# Patient Record
Sex: Female | Born: 1955 | Race: White | Hispanic: No | Smoking: Never smoker
Health system: Southern US, Community
[De-identification: ages and names within clinical notes are randomized; demographics above are authoritative.]

---

## 2020-03-27 ENCOUNTER — Emergency Department: Payer: BC Managed Care – PPO

## 2020-03-27 ENCOUNTER — Other Ambulatory Visit: Payer: Self-pay

## 2020-03-27 ENCOUNTER — Emergency Department
Admission: EM | Admit: 2020-03-27 | Discharge: 2020-03-27 | Disposition: A | Discharge status: Home / Self Care | Payer: BC Managed Care – PPO | Attending: Emergency Medicine | Admitting: Emergency Medicine

## 2020-03-27 DIAGNOSIS — N201 Calculus of ureter: Secondary | ICD-10-CM | POA: Diagnosis not present

## 2020-03-27 DIAGNOSIS — R109 Unspecified abdominal pain: Secondary | ICD-10-CM | POA: Diagnosis present

## 2020-03-27 LAB — BASIC METABOLIC PANEL
Anion gap: 10 (ref 5–15)
BUN: 19 mg/dL (ref 8–23)
CO2: 22 mmol/L (ref 22–32)
Calcium: 9.7 mg/dL (ref 8.9–10.3)
Chloride: 107 mmol/L (ref 98–111)
Creatinine, Ser: 0.94 mg/dL (ref 0.44–1.00)
GFR calc Af Amer: >60 mL/min (ref >60)
GFR calc non Af Amer: >60 mL/min (ref >60)
Glucose, Bld: 167 mg/dL — ABNORMAL HIGH (ref 70–99)
Potassium: 4 mmol/L (ref 3.5–5.1)
Sodium: 139 mmol/L (ref 135–145)

## 2020-03-27 LAB — CBC
HCT: 37.5 % (ref 36.0–46.0)
Hemoglobin: 12.6 g/dL (ref 12.0–15.0)
MCH: 29.8 pg (ref 26.0–34.0)
MCHC: 33.6 g/dL (ref 30.0–36.0)
MCV: 88.7 fL (ref 80.0–100.0)
Platelets: 237 10*3/uL (ref 150–400)
RBC: 4.23 MIL/uL (ref 3.87–5.11)
RDW: 12.6 % (ref 11.5–15.5)
WBC: 10.6 10*3/uL — ABNORMAL HIGH (ref 4.0–10.5)
nRBC: 0 % (ref 0.0–0.2)

## 2020-03-27 LAB — URINALYSIS, COMPLETE (UACMP) WITH MICROSCOPIC
Bacteria, UA: NONE SEEN
Bilirubin Urine: NEGATIVE
Glucose, UA: 50 mg/dL — AB
Ketones, ur: 5 mg/dL — AB
Leukocytes,Ua: NEGATIVE
Nitrite: NEGATIVE
Protein, ur: 30 mg/dL — AB
RBC / HPF: >50 RBC/hpf — ABNORMAL HIGH (ref 0–5)
Specific Gravity, Urine: 1.02 (ref 1.005–1.030)
pH: 7 (ref 5.0–8.0)

## 2020-03-27 MED ORDER — KETOROLAC TROMETHAMINE 30 MG/ML IJ SOLN
30.0000 mg | Freq: Once | INTRAMUSCULAR | Status: AC
  Administered 2020-03-27: 30 mg via INTRAMUSCULAR
  Filled 2020-03-27: qty 1

## 2020-03-27 MED ORDER — ONDANSETRON 4 MG PO TBDP
4.0000 mg | ORAL_TABLET | Freq: Three times a day (TID) | ORAL | 0 refills | Status: DC | PRN
Start: 2020-03-27 — End: 2020-04-13

## 2020-03-27 MED ORDER — OXYCODONE-ACETAMINOPHEN 5-325 MG PO TABS: 1.0000 | ORAL_TABLET | Freq: Four times a day (QID) | ORAL | 0 refills | Status: DC | PRN

## 2020-03-27 MED ORDER — TAMSULOSIN HCL 0.4 MG PO CAPS: 0.4000 mg | ORAL_CAPSULE | Freq: Every day | ORAL | 0 refills | Status: DC

## 2020-03-27 MED ORDER — CEPHALEXIN 500 MG PO CAPS: 500.0000 mg | ORAL_CAPSULE | Freq: Three times a day (TID) | ORAL | 0 refills | Status: DC

## 2020-03-27 NOTE — ED Triage Notes (Signed)
Patient reports right flank pain that radiates into right lower abdomen.  Patient denies urinary symptoms.

## 2020-03-27 NOTE — ED Provider Notes (Signed)
[facility] Emergency Department Provider Note  ____________________________________________   First MD Initiated Contact with Patient 03/27/20 0801     (approximate)  I have reviewed the triage vital signs and the nursing notes.   HISTORY  Chief Complaint Flank Pain   HPI Kelsey Lowery is a 64 y.o. female presents to the ED with complaint of right flank pain that occurred while she was sleeping last evening.  Patient states that she was unable to get comfortable in bed and was up and down a lot during the night.  She states that it eventually started radiating around to her the front of her body and into the groin area.  Patient denies any urinary symptoms.  There is been no nausea, vomiting, fever or chills.  She denies any previous history of stones and no symptoms of UTI currently.  She rates her pain as 6 out of 10.      No past medical history on file.  There are no problems to display for this patient.   Prior to Admission medications   Medication Sig Start Date End Date Taking? Authorizing Provider  cephALEXin (KEFLEX) 500 MG capsule Take 1 capsule (500 mg total) by mouth 3 (three) times daily. 03/27/20   Tommi Rumps, PA-C  ondansetron (ZOFRAN ODT) 4 MG disintegrating tablet Take 1 tablet (4 mg total) by mouth every 8 (eight) hours as needed for nausea or vomiting. 03/27/20   Tommi Rumps, PA-C  oxyCODONE-acetaminophen (PERCOCET) 5-325 MG tablet Take 1 tablet by mouth every 6 (six) hours as needed for severe pain. 03/27/20 03/27/21  Tommi Rumps, PA-C  tamsulosin (FLOMAX) 0.4 MG CAPS capsule Take 1 capsule (0.4 mg total) by mouth daily. 03/27/20   Tommi Rumps, PA-C    Allergies Other and Sulfa antibiotics  No family history on file.  Social History Social History   Tobacco Use  . Smoking status: Not on file  Substance Use Topics  . Alcohol use: Not on file  . Drug use: Not on file    Review of Systems Constitutional: No  fever/chills Eyes: No visual changes. ENT: No sore throat. Cardiovascular: Denies chest pain. Respiratory: Denies shortness of breath. Gastrointestinal: No abdominal pain.  No nausea, no vomiting.  No diarrhea.   Genitourinary: Negative for dysuria.  Positive right flank pain. Musculoskeletal: Negative for muscle skeletal pain. Skin: Negative for rash. Neurological: Negative for headaches, focal weakness or numbness. ____________________________________________   PHYSICAL EXAM:  VITAL SIGNS: ED Triage Vitals  Enc Vitals Group     BP 03/27/20 0531 (!) 151/76     Pulse Rate 03/27/20 0531 86     Resp 03/27/20 0531 19     Temp 03/27/20 0531 98.4 F (36.9 C)     Temp Source 03/27/20 0531 Oral     SpO2 03/27/20 0531 100 %     Weight 03/27/20 0532 126 lb (57.2 kg)     Height 03/27/20 0532 5\' 3"  (1.6 m)     Head Circumference --      Peak Flow --      Pain Score 03/27/20 0532 6     Pain Loc --      Pain Edu? --      Excl. in GC? --     Constitutional: Alert and oriented. Well appearing and in no acute distress. Eyes: Conjunctivae are normal. PERRL. EOMI. Head: Atraumatic. Neck: No stridor.   Cardiovascular: Normal rate, regular rhythm. Grossly normal heart sounds.  Good peripheral  circulation. Respiratory: Normal respiratory effort.  No retractions. Lungs CTAB. Gastrointestinal: Soft and nontender. No distention.  No CVA tenderness. Musculoskeletal: Moves upper and lower extremities they have difficulty.  There is no tenderness on palpation of the thoracic or lumbar spine.  No edema noted lower extremities. Neurologic:  Normal speech and language. No gross focal neurologic deficits are appreciated. No gait instability. Skin:  Skin is warm, dry and intact. No rash noted. Psychiatric: Mood and affect are normal. Speech and behavior are normal.  ____________________________________________   LABS (all labs ordered are listed, but only abnormal results are displayed)  Labs  Reviewed  URINALYSIS, COMPLETE (UACMP) WITH MICROSCOPIC - Abnormal; Notable for the following components:      Result Value   Color, Urine YELLOW (*)    APPearance HAZY (*)    Glucose, UA 50 (*)    Hgb urine dipstick LARGE (*)    Ketones, ur 5 (*)    Protein, ur 30 (*)    RBC / HPF >50 (*)    All other components within normal limits  BASIC METABOLIC PANEL - Abnormal; Notable for the following components:   Glucose, Bld 167 (*)    All other components within normal limits  CBC - Abnormal; Notable for the following components:   WBC 10.6 (*)    All other components within normal limits    RADIOLOGY   Official radiology report(s): CT Renal Stone Study  Result Date: 03/27/2020 CLINICAL DATA:  Right-sided flank pain EXAM: CT ABDOMEN AND PELVIS WITHOUT CONTRAST TECHNIQUE: Multidetector CT imaging of the abdomen and pelvis was performed following the standard protocol without IV contrast. COMPARISON:  None. FINDINGS: Lower chest: Calcified granuloma is noted in the right lung base. No focal infiltrate is seen. Hepatobiliary: No focal liver abnormality is seen. No gallstones, gallbladder wall thickening, or biliary dilatation. Pancreas: Unremarkable. No pancreatic ductal dilatation or surrounding inflammatory changes. Spleen: Normal in size without focal abnormality. Adrenals/Urinary Tract: Adrenal glands are within normal limits. Right kidney demonstrates mild hydronephrosis and hydroureter secondary to a proximal right ureteral stone measuring 5 mm. A few punctate nonobstructing renal stones are noted bilaterally. The bladder is partially distended. Stomach/Bowel: The appendix is within normal limits. Scattered fecal material is noted throughout the colon consistent with mild constipation. No obstructive or inflammatory changes of the colon or small bowel are seen. Stomach is within normal limits. Vascular/Lymphatic: Aortic atherosclerosis. No enlarged abdominal or pelvic lymph nodes.  Reproductive: Uterus and bilateral adnexa are unremarkable. Other: No abdominal wall hernia or abnormality. No abdominopelvic ascites. Musculoskeletal: No acute abnormality noted. IMPRESSION: 5 mm proximal right ureteral stone with obstructive change. Punctate nonobstructing stones bilaterally. Mild constipation. Electronically Signed   By: Alcide Clever M.D.   On: 03/27/2020 09:28    ____________________________________________   PROCEDURES  Procedure(s) performed (including Critical Care):  Procedures   ____________________________________________   INITIAL IMPRESSION / ASSESSMENT AND PLAN / ED COURSE  As part of my medical decision making, I reviewed the following data within the electronic MEDICAL RECORD NUMBER Notes from prior ED visits and [place] Controlled Substance Database  64 year old female presents to the ED with complaint of right flank pain that occurred while sleeping last evening.  Patient states she was unable to get comfortable in bed and was up and down a lot.  She denies any urinary symptoms and no previous stone.  Urinalysis was suspicious for stone with RBCs greater than 50,000.  CT scan shows a 5 mm stone in the right urethra  with some punctate stones in the left urethra.  There is mild hydronephrosis noted on the right.  Patient was made aware.  She was given Toradol 30 mg IM while in the ED.  She is discharged with a prescription for Percocet 5/325 every 6 hours as needed, Zofran ODT 4 mg every 8 hours as needed, Keflex 500 mg 3 times daily for 7 days and Flomax 0.4 mg daily.  Patient states that she had some itching in her 37s with sulfa drugs and Flomax may have a crossover reaction.  Patient was made aware that should she begin itching to discontinue the Flomax immediately and begin taking some Benadryl.  She is to follow-up with Dr. Richardo Hanks who is the urologist on call for any continued problems.  She is to return to the emergency department if any severe worsening of her  symptoms.   ____________________________________________   FINAL CLINICAL IMPRESSION(S) / ED DIAGNOSES  Final diagnoses:  Ureterolithiasis  Right flank pain     ED Discharge Orders         Ordered    oxyCODONE-acetaminophen (PERCOCET) 5-325 MG tablet  Every 6 hours PRN     Discontinue  Reprint     03/27/20 1011    ondansetron (ZOFRAN ODT) 4 MG disintegrating tablet  Every 8 hours PRN     Discontinue  Reprint     03/27/20 1011    cephALEXin (KEFLEX) 500 MG capsule  3 times daily     Discontinue  Reprint     03/27/20 1011    tamsulosin (FLOMAX) 0.4 MG CAPS capsule  Daily     Discontinue  Reprint     03/27/20 1011           Note:  This document was prepared using Dragon voice recognition software and may include unintentional dictation errors.    Tommi Rumps, PA-C 03/27/20 1032    Delton [place], MD 03/27/20 1515

## 2020-03-27 NOTE — Discharge Instructions (Signed)
Call make an appointment with the urologist listed on your discharge papers.  You may also return to the emergency department over the weekend if any severe worsening of your symptoms such as fever, chills, nausea or vomiting.  Begin taking Keflex 500 mg 3 times daily for 7 days to prevent infection.  Zofran if needed for nausea and vomiting.  The pain medication is oxycodone with acetaminophen.  This medication could cause drowsiness and increase your risk for injury.  Do not take this medication while you are driving.  Flomax is the medication I spoke to you about that could cause some itching because of your past history of itching with a sulfa drug.  If this happens discontinue taking the Flomax and begin taking Benadryl for the itching.

## 2020-03-30 ENCOUNTER — Ambulatory Visit
Admission: RE | Admit: 2020-03-30 | Discharge: 2020-03-30 | Disposition: A | Discharge status: Home / Self Care | Payer: BC Managed Care – PPO | Attending: Urology | Admitting: Urology

## 2020-03-30 ENCOUNTER — Other Ambulatory Visit: Admission: RE | Admit: 2020-03-30 | Payer: BC Managed Care – PPO | Source: Home / Self Care

## 2020-03-30 ENCOUNTER — Other Ambulatory Visit: Payer: Self-pay

## 2020-03-30 ENCOUNTER — Other Ambulatory Visit: Payer: Self-pay | Admitting: *Deleted

## 2020-03-30 ENCOUNTER — Ambulatory Visit (INDEPENDENT_AMBULATORY_CARE_PROVIDER_SITE_OTHER): Payer: BC Managed Care – PPO | Admitting: Urology

## 2020-03-30 ENCOUNTER — Ambulatory Visit
Admission: RE | Admit: 2020-03-30 | Discharge: 2020-03-30 | Disposition: A | Discharge status: Home / Self Care | Payer: BC Managed Care – PPO | Source: Ambulatory Visit | Attending: Urology | Admitting: Urology

## 2020-03-30 ENCOUNTER — Encounter: Payer: Self-pay | Admitting: Urology

## 2020-03-30 VITALS — BP 136/71 | HR 77 | Ht 62.5 in | Wt 128.0 lb

## 2020-03-30 DIAGNOSIS — N200 Calculus of kidney: Secondary | ICD-10-CM

## 2020-03-30 NOTE — Patient Instructions (Signed)
Kidney Stones  Kidney stones are solid, rock-like deposits that form inside of the kidneys. The kidneys are a pair of organs that make urine. A kidney stone may form in a kidney and move into other parts of the urinary tract, including the tubes that connect the kidneys to the bladder (ureters), the bladder, and the tube that carries urine out of the body (urethra). As the stone moves through these areas, it can cause intense pain and block the flow of urine. Kidney stones are created when high levels of certain minerals are found in the urine. The stones are usually passed out of the body through urination, but in some cases, medical treatment may be needed to remove them. What are the causes? Kidney stones may be caused by:  A condition in which certain glands produce too much parathyroid hormone (primary hyperparathyroidism), which causes too much calcium buildup in the blood.  A buildup of uric acid crystals in the bladder (hyperuricosuria). Uric acid is a chemical that the body produces when you eat certain foods. It usually exits the body in the urine.  Narrowing (stricture) of one or both of the ureters.  A kidney blockage that is present at birth (congenital obstruction).  Past surgery on the kidney or the ureters, such as gastric bypass surgery. What increases the risk? The following factors may make you more likely to develop this condition:  Having had a kidney stone in the past.  Having a family history of kidney stones.  Not drinking enough water.  Eating a diet that is high in protein, salt (sodium), or sugar.  Being overweight or obese. What are the signs or symptoms? Symptoms of a kidney stone may include:  Pain in the side of the abdomen, right below the ribs (flank pain). Pain usually spreads (radiates) to the groin.  Needing to urinate frequently or urgently.  Painful urination.  Blood in the urine (hematuria).  Nausea.  Vomiting.  Fever and chills. How  is this diagnosed? This condition may be diagnosed based on:  Your symptoms and medical history.  A physical exam.  Blood tests.  Urine tests. These may be done before and after the stone passes out of your body through urination.  Imaging tests, such as a CT scan, abdominal X-ray, or ultrasound.  A procedure to examine the inside of the bladder (cystoscopy). How is this treated? Treatment for kidney stones depends on the size, location, and makeup of the stones. Kidney stones will often pass out of the body through urination. You may need to:  Increase your fluid intake to help pass the stone. In some cases, you may be given fluids through an IV and may need to be monitored at the hospital.  Take medicine for pain.  Make changes in your diet to help prevent kidney stones from coming back. Sometimes, medical procedures are needed to remove a kidney stone. This may involve:  A procedure to break up kidney stones using: ? A focused beam of light (laser therapy). ? Shock waves (extracorporeal shock wave lithotripsy).  Surgery to remove kidney stones. This may be needed if you have severe pain or have stones that block your urinary tract. Follow these instructions at home: Medicines  Take over-the-counter and prescription medicines only as told by your health care provider.  Ask your health care provider if the medicine prescribed to you requires you to avoid driving or using heavy machinery. Eating and drinking  Drink enough fluid to keep your urine pale yellow.   You may be instructed to drink at least 8-10 glasses of water each day. This will help you pass the kidney stone.  If directed, change your diet. This may include: ? Limiting how much sodium you eat. ? Eating more fruits and vegetables. ? Limiting how much animal protein--such as red meat, poultry, fish, and eggs--you eat.  Follow instructions from your health care provider about eating or drinking  restrictions. General instructions  Collect urine samples as told by your health care provider. You may need to collect a urine sample: ? 24 hours after you pass the stone. ? 8-12 weeks after passing the kidney stone, and every 6-12 months after that.  Strain your urine every time you urinate, for as long as directed. Use the strainer that your health care provider recommends.  Do not throw out the kidney stone after passing it. Keep the stone so it can be tested by your health care provider. Testing the makeup of your kidney stone may help prevent you from getting kidney stones in the future.  Keep all follow-up visits as told by your health care provider. This is important. You may need follow-up X-rays or ultrasounds to make sure that your stone has passed. How is this prevented? To prevent another kidney stone:  Drink enough fluid to keep your urine pale yellow. This is the best way to prevent kidney stones.  Eat a healthy diet and follow recommendations from your health care provider about foods to avoid. You may be instructed to eat a low-protein diet. Recommendations vary depending on the type of kidney stone that you have.  Maintain a healthy weight. Where to find more information  [place] ([facility]): www.kidney.[facility]): www.urologyhealth.org Contact a health care provider if:  You have pain that gets worse or does not get better with medicine. Get help right away if:  You have a fever or chills.  You develop severe pain.  You develop new abdominal pain.  You faint.  You are unable to urinate. Summary  Kidney stones are solid, rock-like deposits that form inside of the kidneys.  Kidney stones can cause nausea, vomiting, blood in the urine, abdominal pain, and the urge to urinate frequently.  Treatment for kidney stones depends on the size, location, and makeup of the stones. Kidney stones will often pass out of the body  through urination.  Kidney stones can be prevented by drinking enough fluids, eating a healthy diet, and maintaining a healthy weight. This information is not intended to replace advice given to you by your health care provider. Make sure you discuss any questions you have with your health care provider. Document Revised: 12/17/2018 Document Reviewed: 12/17/2018 Elsevier Patient Education  2020 [place] is a treatment that can sometimes help eliminate kidney stones and the pain that they cause. A form of lithotripsy, also known as extracorporeal shock wave lithotripsy, is a nonsurgical procedure that crushes a kidney stone with shock waves. These shock waves pass through your body and focus on the kidney stone. They cause the kidney stone to break up while it is still in the urinary tract. This makes it easier for the smaller pieces of stone to pass in the urine. Tell a health care provider about:  Any allergies you have.  All medicines you are taking, including vitamins, herbs, eye drops, creams, and over-the-counter medicines.  Any blood disorders you have.  Any surgeries you have had.  Any medical conditions  you have.  Whether you are pregnant or may be pregnant.  Any problems you or family members have had with anesthetic medicines. What are the risks? Generally, this is a safe procedure. However, problems may occur, including:  Infection.  Bleeding of the kidney.  Bruising of the kidney or skin.  Scarring of the kidney, which can lead to: ? Increased blood pressure. ? Poor kidney function. ? Return (recurrence) of kidney stones.  Damage to other structures or organs, such as the liver, colon, spleen, or pancreas.  Blockage (obstruction) of the the tube that carries urine from the kidney to the bladder (ureter).  Failure of the kidney stone to break into pieces (fragments). What happens before the procedure? Staying hydrated Follow  instructions from your health care provider about hydration, which may include:  Up to 2 hours before the procedure - you may continue to drink clear liquids, such as water, clear fruit juice, black coffee, and plain tea. Eating and drinking restrictions Follow instructions from your health care provider about eating and drinking, which may include:  8 hours before the procedure - stop eating heavy meals or foods such as meat, fried foods, or fatty foods.  6 hours before the procedure - stop eating light meals or foods, such as toast or cereal.  6 hours before the procedure - stop drinking milk or drinks that contain milk.  2 hours before the procedure - stop drinking clear liquids. General instructions  Plan to have someone take you home from the hospital or clinic.  Ask your health care provider about: ? Changing or stopping your regular medicines. This is especially important if you are taking diabetes medicines or blood thinners. ? Taking medicines such as aspirin and ibuprofen. These medicines and other NSAIDs can thin your blood. Do not take these medicines for 7 days before your procedure if your health care provider instructs you not to.  You may have tests, such as: ? Blood tests. ? Urine tests. ? Imaging tests, such as a CT scan. What happens during the procedure?  To lower your risk of infection: ? Your health care team will wash or sanitize their hands. ? Your skin will be washed with soap.  An IV tube will be inserted into one of your veins. This tube will give you fluids and medicines.  You will be given one or more of the following: ? A medicine to help you relax (sedative). ? A medicine to make you fall asleep (general anesthetic).  A water-filled cushion may be placed behind your kidney or on your abdomen. In some cases you may be placed in a tub of lukewarm water.  Your body will be positioned in a way that makes it easy to target the kidney stone.  A  flexible tube with holes in it (stent) may be placed in the ureter. This will help keep urine flowing from the kidney if the fragments of the stone have been blocking the ureter.  An X-ray or ultrasound exam will be done to locate your stone.  Shock waves will be aimed at the stone. If you are awake, you may feel a tapping sensation as the shock waves pass through your body. The procedure may vary among health care providers and hospitals. What happens after the procedure?  You may have an X-ray to see whether the procedure was able to break up the kidney stone and how much of the stone has passed. If large stone fragments remain after treatment, you may  need to have a second procedure at a later time.  Your blood pressure, heart rate, breathing rate, and blood oxygen level will be monitored until the medicines you were given have worn off.  You may be given antibiotics or pain medicine as needed.  If a stent was placed in your ureter during surgery, it may stay in place for a few weeks.  You may need strain your urine to collect pieces of the kidney stone for testing.  You will need to drink plenty of water.  Do not drive for 24 hours if you were given a sedative. Summary  Lithotripsy is a treatment that can sometimes help eliminate kidney stones and the pain that they cause.  A form of lithotripsy, also known as extracorporeal shock wave lithotripsy, is a nonsurgical procedure that crushes a kidney stone with shock waves.  Generally, this is a safe procedure. However, problems may occur, including damage to the kidney or other organs, infection, or obstruction of the tube that carries urine from the kidney to the bladder (ureter).  When you go home, you will need to drink plenty of water. You may be asked to strain your urine to collect pieces of the kidney stone for testing. This information is not intended to replace advice given to you by your health care provider. Make sure you  discuss any questions you have with your health care provider. Document Revised: 11/11/2018 Document Reviewed: 06/21/2016 Elsevier Patient Education  2020 [employer]   Dietary Guidelines to Help Prevent Kidney Stones Kidney stones are deposits of minerals and salts that form inside your kidneys. Your risk of developing kidney stones may be greater depending on your diet, your lifestyle, the medicines you take, and whether you have certain medical conditions. Most people can reduce their chances of developing kidney stones by following the instructions below. Depending on your overall health and the type of kidney stones you tend to develop, your dietitian may give you more specific instructions. What are tips for following this plan? Reading food labels  Choose foods with "no salt added" or "low-salt" labels. Limit your sodium intake to less than 1500 mg per day.  Choose foods with calcium for each meal and snack. Try to eat about 300 mg of calcium at each meal. Foods that contain 200-500 mg of calcium per serving include: ? 8 oz (237 ml) of milk, fortified nondairy milk, and fortified fruit juice. ? 8 oz (237 ml) of kefir, yogurt, and soy yogurt. ? 4 oz (118 ml) of tofu. ? 1 oz of cheese. ? 1 cup (300 g) of dried figs. ? 1 cup (91 g) of cooked broccoli. ? 1-3 oz can of sardines or mackerel.  Most people need 1000 to 1500 mg of calcium each day. Talk to your dietitian about how much calcium is recommended for you. Shopping  Buy plenty of fresh fruits and vegetables. Most people do not need to avoid fruits and vegetables, even if they contain nutrients that may contribute to kidney stones.  When shopping for convenience foods, choose: ? Whole pieces of fruit. ? Premade salads with dressing on the side. ? Low-fat fruit and yogurt smoothies.  Avoid buying frozen meals or prepared deli foods.  Look for foods with live cultures, such as yogurt and kefir. Cooking  Do not add salt to  food when cooking. Place a salt shaker on the table and allow each person to add his or her own salt to taste.  Use vegetable protein, such  as beans, textured vegetable protein (TVP), or tofu instead of meat in pasta, casseroles, and soups. Meal planning   Eat less salt, if told by your dietitian. To do this: ? Avoid eating processed or premade food. ? Avoid eating fast food.  Eat less animal protein, including cheese, meat, poultry, or fish, if told by your dietitian. To do this: ? Limit the number of times you have meat, poultry, fish, or cheese each week. Eat a diet free of meat at least 2 days a week. ? Eat only one serving each day of meat, poultry, fish, or seafood. ? When you prepare animal protein, cut pieces into small portion sizes. For most meat and fish, one serving is about the size of one deck of cards.  Eat at least 5 servings of fresh fruits and vegetables each day. To do this: ? Keep fruits and vegetables on hand for snacks. ? Eat 1 piece of fruit or a handful of berries with breakfast. ? Have a salad and fruit at lunch. ? Have two kinds of vegetables at dinner.  Limit foods that are high in a substance called oxalate. These include: ? Spinach. ? Rhubarb. ? Beets. ? Potato chips and french fries. ? Nuts.  If you regularly take a diuretic medicine, make sure to eat at least 1-2 fruits or vegetables high in potassium each day. These include: ? Avocado. ? Banana. ? Orange, prune, carrot, or tomato juice. ? Baked potato. ? Cabbage. ? Beans and split peas. General instructions   Drink enough fluid to keep your urine clear or pale yellow. This is the most important thing you can do.  Talk to your health care provider and dietitian about taking daily supplements. Depending on your health and the cause of your kidney stones, you may be advised: ? Not to take supplements with vitamin C. ? To take a calcium supplement. ? To take a daily probiotic supplement. ? To  take other supplements such as magnesium, fish oil, or vitamin B6.  Take all medicines and supplements as told by your health care provider.  Limit alcohol intake to no more than 1 drink a day for nonpregnant women and 2 drinks a day for men. One drink equals 12 oz of beer, 5 oz of wine, or 1 oz of hard liquor.  Lose weight if told by your health care provider. Work with your dietitian to find strategies and an eating plan that works best for you. What foods are not recommended? Limit your intake of the following foods, or as told by your dietitian. Talk to your dietitian about specific foods you should avoid based on the type of kidney stones and your overall health. Grains Breads. Bagels. Rolls. Baked goods. Salted crackers. Cereal. Pasta. Vegetables Spinach. Rhubarb. Beets. Canned vegetables. Rosita Fire. Olives. Meats and other protein foods Nuts. Nut butters. Large portions of meat, poultry, or fish. Salted or cured meats. Deli meats. Hot dogs. Sausages. Dairy Cheese. Beverages Regular soft drinks. Regular vegetable juice. Seasonings and other foods Seasoning blends with salt. Salad dressings. Canned soups. Soy sauce. Ketchup. Barbecue sauce. Canned pasta sauce. Casseroles. Pizza. Lasagna. Frozen meals. Potato chips. [place] fries. Summary  You can reduce your risk of kidney stones by making changes to your diet.  The most important thing you can do is drink enough fluid. You should drink enough fluid to keep your urine clear or pale yellow.  Ask your health care provider or dietitian how much protein from animal sources you should eat each  day, and also how much salt and calcium you should have each day. This information is not intended to replace advice given to you by your health care provider. Make sure you discuss any questions you have with your health care provider. Document Revised: 11/20/2018 Document Reviewed: 07/11/2016 Elsevier Patient Education  2020 [employer].

## 2020-03-30 NOTE — Progress Notes (Signed)
   03/30/20 3:42 PM   Clarisa Fling 1956/01/30 [id number]  CC: Right proximal ureteral stone  HPI: I saw Ms. Kelsey Lowery in urology clinic today for evaluation of a 6 mm right proximal ureteral stone.  She originally noticed some abdominal discomfort 4 to 5 days ago, that ultimately escalated to severe right-sided flank pain.  She presented to the ED on 03/27/2020 and CT showed a 6 mm right proximal ureteral stone with upstream hydronephrosis.  Urinalysis and lab work were benign and she was discharged home with pain medications.  She denies any prior history of stones.  She has not had any pain for the last 2 days and is comfortable today.  She has had some worsening urinary urgency and frequency, but denies any dysuria.  She denies any fevers or chills.    Social History:  reports that she has never smoked. She has never used smokeless tobacco. She reports that she does not drink alcohol and does not use drugs.  Physical Exam: BP 136/71   Pulse 77   Ht 5' 2.5" (1.588 m)   Wt 128 lb (58.1 kg)   BMI 23.04 kg/m    Constitutional:  Alert and oriented, No acute distress. Cardiovascular: No clubbing, cyanosis, or edema. Respiratory: Normal respiratory effort, no increased work of breathing. GI: Abdomen is soft, nontender, nondistended, no abdominal masses GU: No CVA tenderness  Laboratory Data: Reviewed, see HPI  Pertinent Imaging: I have personally reviewed the CT stone protocol from 8/14 2021 as well as the KUB today.  On the CT there is a 6 mm proximal ureteral stone with upstream hydronephrosis, 9cm SSD, 900 HU.  On KUB today, the stone appears to have migrated to the distal ureter.  Assessment & Plan:   In summary, she is a 64 year old female with a 6 mm right ureteral stone and well-controlled pain with no clinical or laboratory signs of infection.  We discussed various treatment options for urolithiasis including observation with or without medical expulsive therapy, shockwave  lithotripsy (SWL), ureteroscopy and laser lithotripsy with stent placement, and percutaneous nephrolithotomy.  We discussed that management is based on stone size, location, density, patient co-morbidities, and patient preference.   Stones <67mm in size have a >80% spontaneous passage rate. Data surrounding the use of tamsulosin for medical expulsive therapy is controversial, but meta analyses suggests it is most efficacious for distal stones between 5-34mm in size. Possible side effects include dizziness/lightheadedness, and retrograde ejaculation.  SWL has a lower stone free rate in a single procedure, but also a lower complication rate compared to ureteroscopy and avoids a stent and associated stent related symptoms. Possible complications include renal hematoma, steinstrasse, and need for additional treatment.  Ureteroscopy with laser lithotripsy and stent placement has a higher stone free rate than SWL in a single procedure, however increased complication rate including possible infection, ureteral injury, bleeding, and stent related morbidity. Common stent related symptoms include dysuria, urgency/frequency, and flank pain.  She would like to trial medical expulsive therapy for the next few weeks, she currently has minimal symptoms.  We discussed return precautions at length.  Strain urine.  RTC 2 weeks with repeat KUB, sooner if uncontrolled pain.  Legrand Rams, MD 03/30/2020  [facility] [address] [place], [place] [address] [phone]

## 2020-04-07 ENCOUNTER — Ambulatory Visit: Payer: BC Managed Care – PPO | Admitting: Urology

## 2020-04-12 ENCOUNTER — Other Ambulatory Visit: Payer: Self-pay | Admitting: *Deleted

## 2020-04-12 DIAGNOSIS — N200 Calculus of kidney: Secondary | ICD-10-CM

## 2020-04-13 ENCOUNTER — Ambulatory Visit
Admission: RE | Admit: 2020-04-13 | Discharge: 2020-04-13 | Disposition: A | Discharge status: Home / Self Care | Payer: BC Managed Care – PPO | Source: Ambulatory Visit | Attending: Urology | Admitting: Urology

## 2020-04-13 ENCOUNTER — Other Ambulatory Visit: Payer: Self-pay

## 2020-04-13 ENCOUNTER — Encounter: Payer: Self-pay | Admitting: Urology

## 2020-04-13 ENCOUNTER — Other Ambulatory Visit
Admission: RE | Admit: 2020-04-13 | Discharge: 2020-04-13 | Disposition: A | Discharge status: Home / Self Care | Payer: BC Managed Care – PPO | Source: Home / Self Care | Attending: Urology | Admitting: Urology

## 2020-04-13 ENCOUNTER — Ambulatory Visit: Payer: BC Managed Care – PPO | Admitting: Urology

## 2020-04-13 ENCOUNTER — Ambulatory Visit
Admission: RE | Admit: 2020-04-13 | Discharge: 2020-04-13 | Disposition: A | Discharge status: Home / Self Care | Payer: BC Managed Care – PPO | Attending: Urology | Admitting: Urology

## 2020-04-13 VITALS — BP 120/74 | HR 92 | Ht 62.0 in | Wt 129.0 lb

## 2020-04-13 DIAGNOSIS — N200 Calculus of kidney: Secondary | ICD-10-CM

## 2020-04-13 LAB — URINALYSIS, COMPLETE (UACMP) WITH MICROSCOPIC
Bilirubin Urine: NEGATIVE
Glucose, UA: NEGATIVE mg/dL
Hgb urine dipstick: NEGATIVE
Ketones, ur: NEGATIVE mg/dL
Leukocytes,Ua: NEGATIVE
Nitrite: NEGATIVE
Protein, ur: NEGATIVE mg/dL
RBC / HPF: NONE SEEN RBC/hpf (ref 0–5)
Specific Gravity, Urine: 1.015 (ref 1.005–1.030)
pH: 7.5 (ref 5.0–8.0)

## 2020-04-13 NOTE — Patient Instructions (Signed)
Kidney Stones  Kidney stones are solid, rock-like deposits that form inside of the kidneys. The kidneys are a pair of organs that make urine. A kidney stone may form in a kidney and move into other parts of the urinary tract, including the tubes that connect the kidneys to the bladder (ureters), the bladder, and the tube that carries urine out of the body (urethra). As the stone moves through these areas, it can cause intense pain and block the flow of urine. Kidney stones are created when high levels of certain minerals are found in the urine. The stones are usually passed out of the body through urination, but in some cases, medical treatment may be needed to remove them. What are the causes? Kidney stones may be caused by:  A condition in which certain glands produce too much parathyroid hormone (primary hyperparathyroidism), which causes too much calcium buildup in the blood.  A buildup of uric acid crystals in the bladder (hyperuricosuria). Uric acid is a chemical that the body produces when you eat certain foods. It usually exits the body in the urine.  Narrowing (stricture) of one or both of the ureters.  A kidney blockage that is present at birth (congenital obstruction).  Past surgery on the kidney or the ureters, such as gastric bypass surgery. What increases the risk? The following factors may make you more likely to develop this condition:  Having had a kidney stone in the past.  Having a family history of kidney stones.  Not drinking enough water.  Eating a diet that is high in protein, salt (sodium), or sugar.  Being overweight or obese. What are the signs or symptoms? Symptoms of a kidney stone may include:  Pain in the side of the abdomen, right below the ribs (flank pain). Pain usually spreads (radiates) to the groin.  Needing to urinate frequently or urgently.  Painful urination.  Blood in the urine (hematuria).  Nausea.  Vomiting.  Fever and chills. How  is this diagnosed? This condition may be diagnosed based on:  Your symptoms and medical history.  A physical exam.  Blood tests.  Urine tests. These may be done before and after the stone passes out of your body through urination.  Imaging tests, such as a CT scan, abdominal X-ray, or ultrasound.  A procedure to examine the inside of the bladder (cystoscopy). How is this treated? Treatment for kidney stones depends on the size, location, and makeup of the stones. Kidney stones will often pass out of the body through urination. You may need to:  Increase your fluid intake to help pass the stone. In some cases, you may be given fluids through an IV and may need to be monitored at the hospital.  Take medicine for pain.  Make changes in your diet to help prevent kidney stones from coming back. Sometimes, medical procedures are needed to remove a kidney stone. This may involve:  A procedure to break up kidney stones using: ? A focused beam of light (laser therapy). ? Shock waves (extracorporeal shock wave lithotripsy).  Surgery to remove kidney stones. This may be needed if you have severe pain or have stones that block your urinary tract. Follow these instructions at home: Medicines  Take over-the-counter and prescription medicines only as told by your health care provider.  Ask your health care provider if the medicine prescribed to you requires you to avoid driving or using heavy machinery. Eating and drinking  Drink enough fluid to keep your urine pale yellow.   You may be instructed to drink at least 8-10 glasses of water each day. This will help you pass the kidney stone.  If directed, change your diet. This may include: ? Limiting how much sodium you eat. ? Eating more fruits and vegetables. ? Limiting how much animal protein--such as red meat, poultry, fish, and eggs--you eat.  Follow instructions from your health care provider about eating or drinking  restrictions. General instructions  Collect urine samples as told by your health care provider. You may need to collect a urine sample: ? 24 hours after you pass the stone. ? 8-12 weeks after passing the kidney stone, and every 6-12 months after that.  Strain your urine every time you urinate, for as long as directed. Use the strainer that your health care provider recommends.  Do not throw out the kidney stone after passing it. Keep the stone so it can be tested by your health care provider. Testing the makeup of your kidney stone may help prevent you from getting kidney stones in the future.  Keep all follow-up visits as told by your health care provider. This is important. You may need follow-up X-rays or ultrasounds to make sure that your stone has passed. How is this prevented? To prevent another kidney stone:  Drink enough fluid to keep your urine pale yellow. This is the best way to prevent kidney stones.  Eat a healthy diet and follow recommendations from your health care provider about foods to avoid. You may be instructed to eat a low-protein diet. Recommendations vary depending on the type of kidney stone that you have.  Maintain a healthy weight. Where to find more information  [employer] (NKF): www.kidney.org  [facility] ([facility]): www.urologyhealth.org Contact a health care provider if:  You have pain that gets worse or does not get better with medicine. Get help right away if:  You have a fever or chills.  You develop severe pain.  You develop new abdominal pain.  You faint.  You are unable to urinate. Summary  Kidney stones are solid, rock-like deposits that form inside of the kidneys.  Kidney stones can cause nausea, vomiting, blood in the urine, abdominal pain, and the urge to urinate frequently.  Treatment for kidney stones depends on the size, location, and makeup of the stones. Kidney stones will often pass out of the body  through urination.  Kidney stones can be prevented by drinking enough fluids, eating a healthy diet, and maintaining a healthy weight. This information is not intended to replace advice given to you by your health care provider. Make sure you discuss any questions you have with your health care provider. Document Revised: 12/17/2018 Document Reviewed: 12/17/2018 Elsevier Patient Education  2020 [employer]  

## 2020-04-13 NOTE — Progress Notes (Signed)
   04/13/2020 3:43 PM   Clarisa Fling [birth year] [id number]  Reason for visit: Follow up 6 mm right ureteral stone  HPI: I saw Ms. Schaum in urology clinic for follow-up of a right ureteral stone.  Briefly she is a 64 year old female that originally presented to the ED on 8/14 with severe right flank pain and CT showed a 6 mm right proximal ureteral stone.  She was discharged home with medical expulsive therapy.  I saw her on 8/17 and KUB suggested stone had migrated to the right distal ureter and she opted for medical expulsive therapy.  She reports over the last week her pain has completely resolved.  I personally reviewed the KUB today that shows no evidence of residual right distal ureteral stone.  She has some persistent urgency and frequency, but she is not sure if this is just because she is focused on the urination and possibility of the stone still being present over the last week or 2.  We reviewed her images together in clinic today.  We discussed general stone prevention strategies including adequate hydration with goal of producing 2.5 L of urine daily, increasing citric acid intake, increasing calcium intake during high oxalate meals, minimizing animal protein, and decreasing salt intake. Information about dietary recommendations given today.   I recommended a urinalysis today to confirm no evidence of infection or persistent microscopic hematuria, call with results   Sondra Come, MD  [facility] [address] [place], [place] [address] [phone]

## 2020-04-14 ENCOUNTER — Telehealth: Payer: Self-pay

## 2020-04-14 LAB — URINE CULTURE: Culture: <10000 — AB

## 2020-04-14 NOTE — Telephone Encounter (Signed)
-----   Message from Sondra Come, MD sent at 04/14/2020  8:30 AM EDT ----- No infection or blood on UA, follow up as needed  Legrand Rams, MD 04/14/2020

## 2020-04-14 NOTE — Telephone Encounter (Signed)
-----   Message from Brian C Sninsky, MD sent at 04/14/2020  8:30 AM EDT ----- °No infection or blood on UA, follow up as needed ° °Brian Sninsky, MD °04/14/2020 ° ° °

## 2020-04-14 NOTE — Telephone Encounter (Signed)
Called pt informed her of the information below. Pt gave verbal understanding.  

## 2020-11-04 IMAGING — CT CT RENAL STONE PROTOCOL
2 of 4 series · 16 of 46 positions shown, 18 images · non-contrast
Comparison: None.

CLINICAL DATA: Right-sided flank pain

EXAM:
CT ABDOMEN AND PELVIS WITHOUT CONTRAST
TECHNIQUE: Multidetector CT imaging of the abdomen and pelvis was performed
following the standard protocol without IV contrast.

[Series 2: stone full standard · axial · 0.67mm/px · z∈[-470,-95]mm · 13 of 83 slices shown, 15 images]
[im 4/83  soft-tissue]
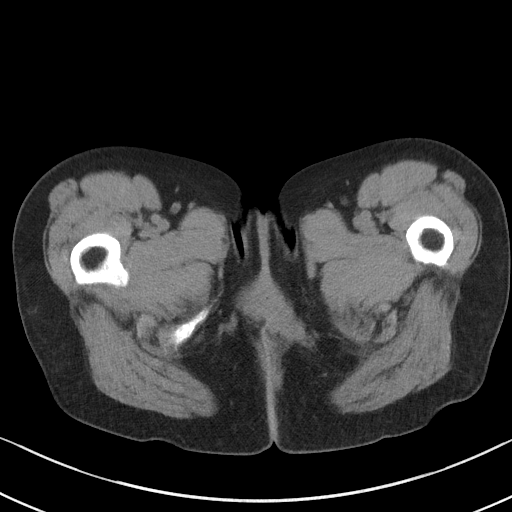
[im 4/83  bone]
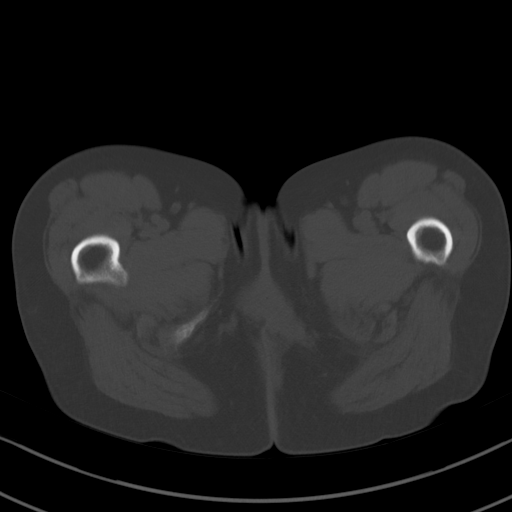
[im 11/83  soft-tissue]
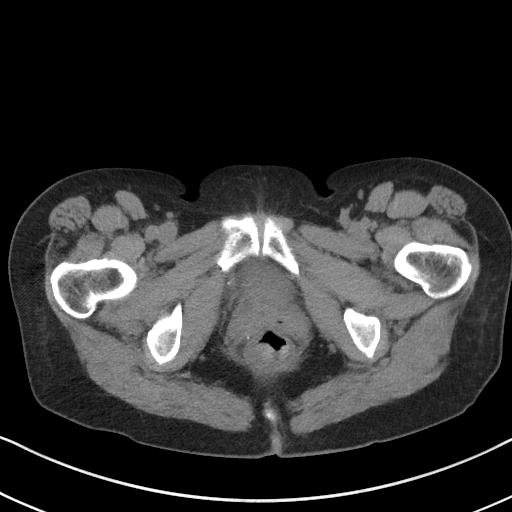
[im 18/83  soft-tissue]
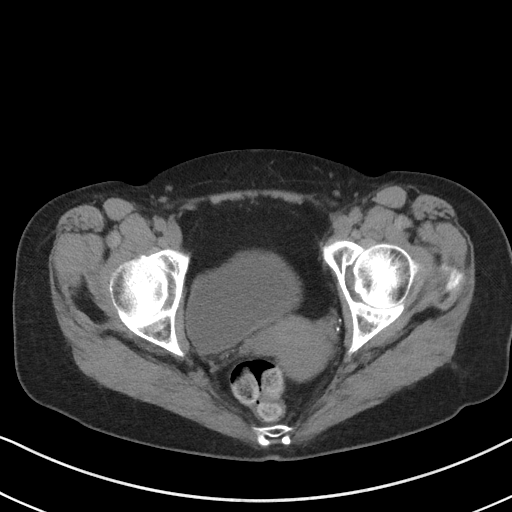
[im 24/83  soft-tissue]
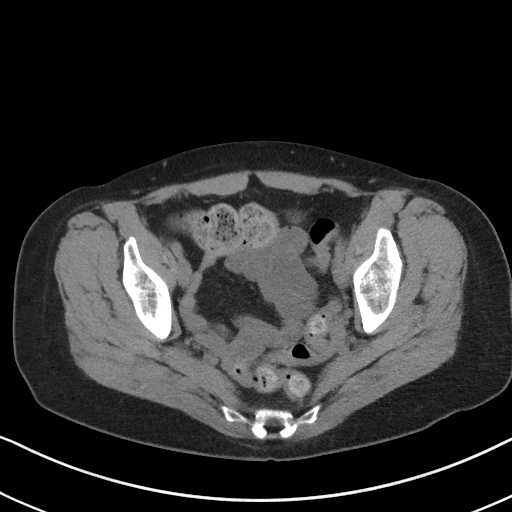
[im 28/83  soft-tissue]
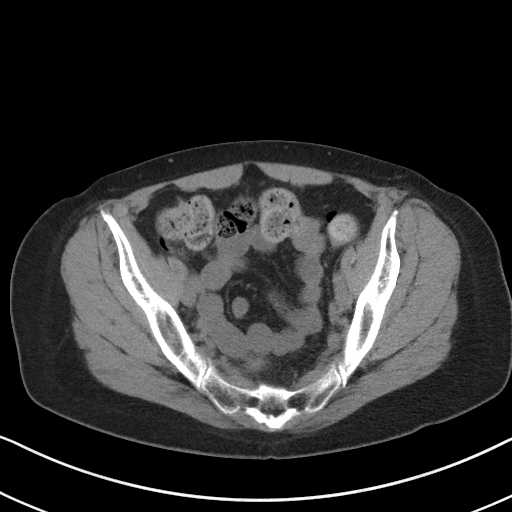
[im 35/83  soft-tissue]
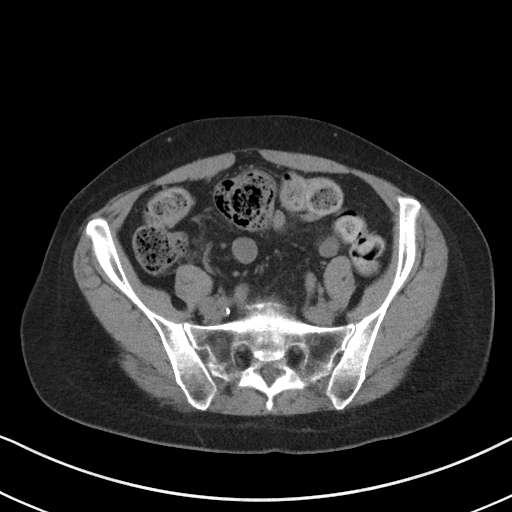
[im 42/83  soft-tissue]
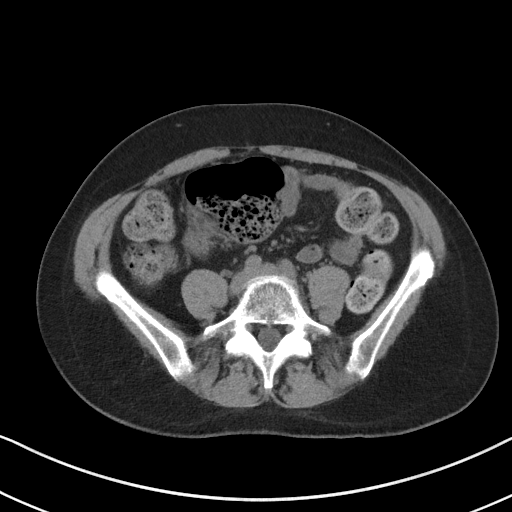
[im 48/83  soft-tissue]
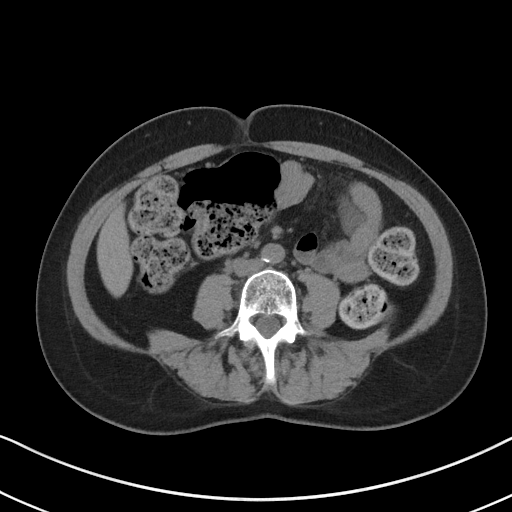
[im 55/83  soft-tissue]
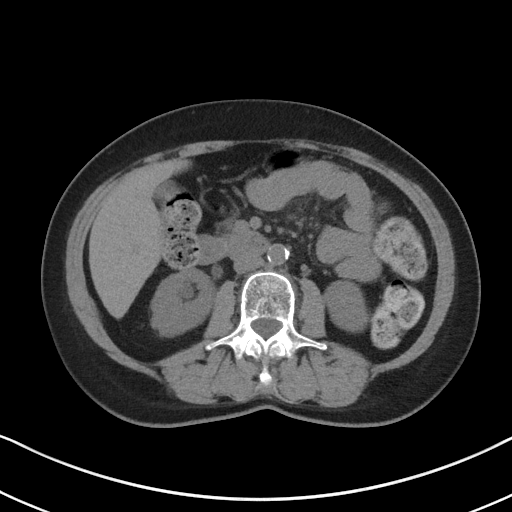
[im 55/83  bone]
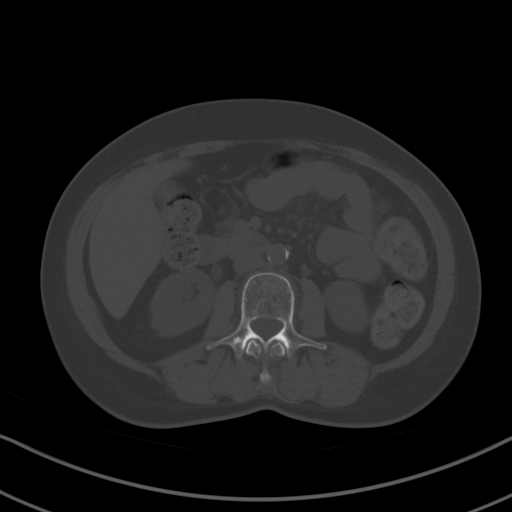
[im 59/83  soft-tissue]
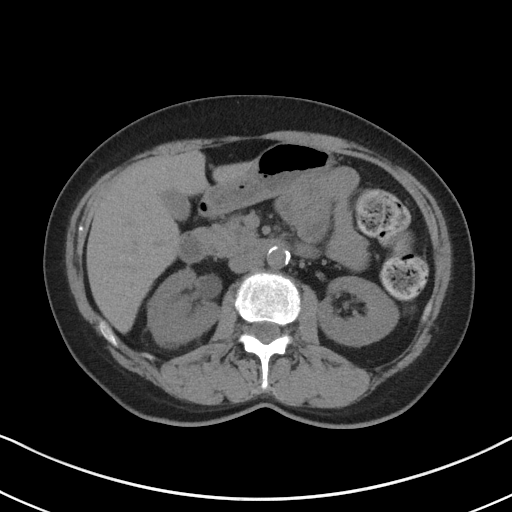
[im 65/83  soft-tissue]
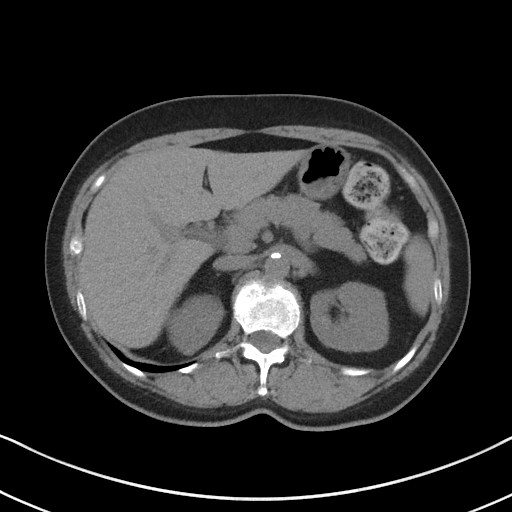
[im 72/83  soft-tissue]
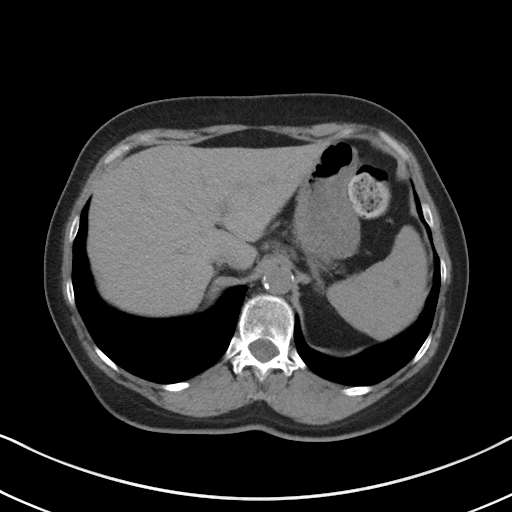
[im 79/83  soft-tissue]
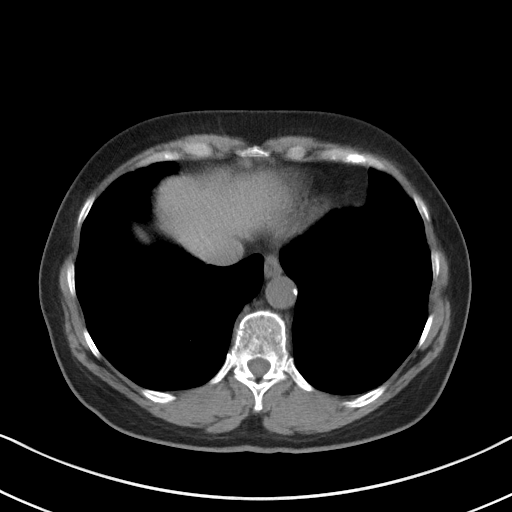

[Series 6: coronal · coronal · 0.65mm/px · 3 of 116 slices shown]
[im 39/116  soft-tissue]
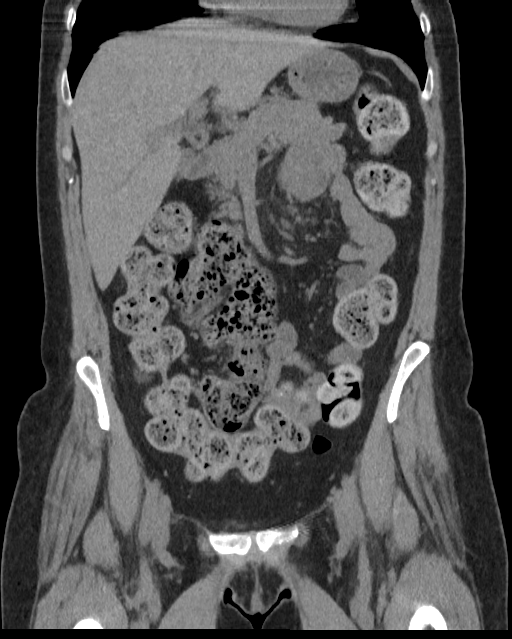
[im 52/116  soft-tissue]
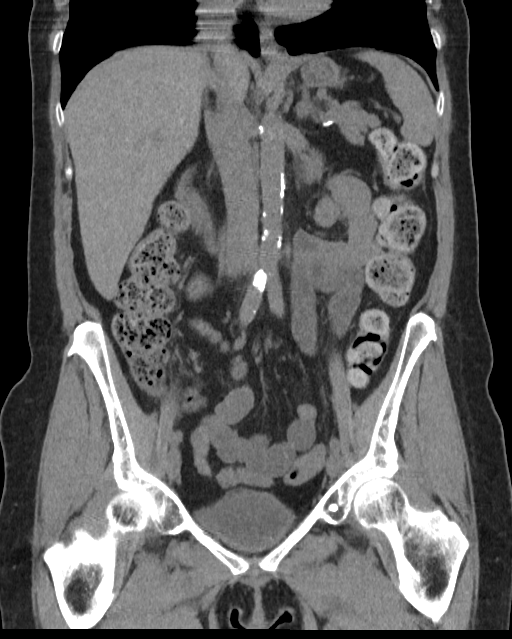
[im 64/116  soft-tissue]
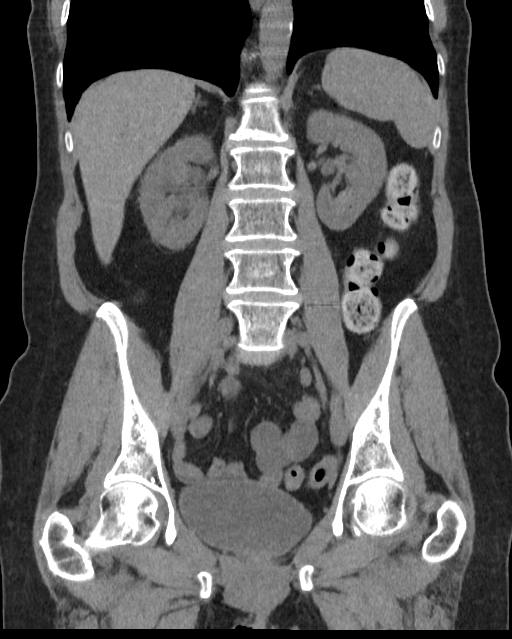

[16 of 46 positions shown; findings below may reference images not displayed]

FINDINGS: Lower chest: Calcified granuloma is noted in the right lung base. No
focal infiltrate is seen.

Hepatobiliary: No focal liver abnormality is seen. No gallstones,
gallbladder wall thickening, or biliary dilatation.

Pancreas: Unremarkable. No pancreatic ductal dilatation or
surrounding inflammatory changes.

Spleen: Normal in size without focal abnormality.

Adrenals/Urinary Tract: Adrenal glands are within normal limits.
Right kidney demonstrates mild hydronephrosis and hydroureter
secondary to a proximal right ureteral stone measuring 5 mm. A few
punctate nonobstructing renal stones are noted bilaterally. The
bladder is partially distended.

Stomach/Bowel: The appendix is within normal limits. Scattered fecal
material is noted throughout the colon consistent with mild
constipation. No obstructive or inflammatory changes of the colon or
small bowel are seen. Stomach is within normal limits.

Vascular/Lymphatic: Aortic atherosclerosis. No enlarged abdominal or
pelvic lymph nodes.

Reproductive: Uterus and bilateral adnexa are unremarkable.

Other: No abdominal wall hernia or abnormality. No abdominopelvic
ascites.

Musculoskeletal: No acute abnormality noted.
IMPRESSION: 5 mm proximal right ureteral stone with obstructive change.

Punctate nonobstructing stones bilaterally.

Mild constipation.

## 2020-11-21 IMAGING — CR DG ABDOMEN 1V
2 series · 2 of 2 positions shown · non-contrast
Comparison: 03/30/2020

CLINICAL DATA: Right flank pain over the last 2-3 weeks.

EXAM:
ABDOMEN - 1 VIEW

[abdomen kub (1 of 2)]
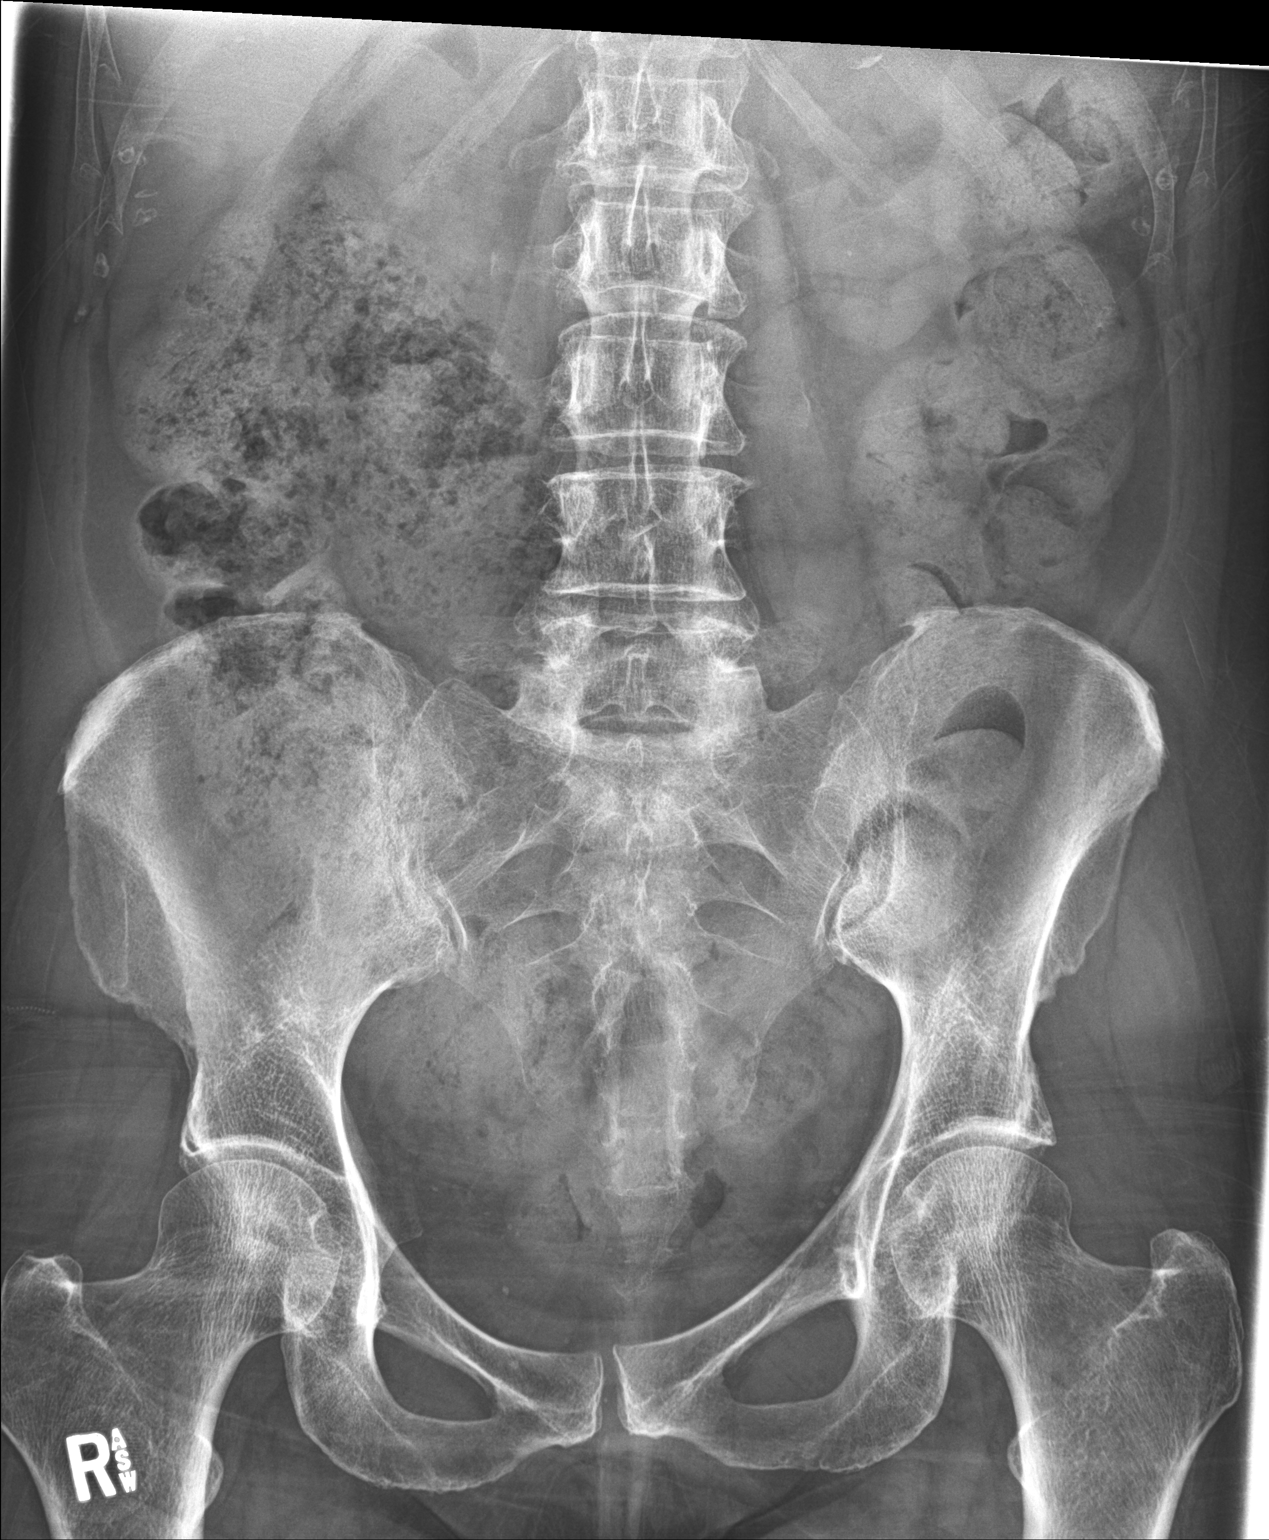

[abdomen kub (2 of 2)]
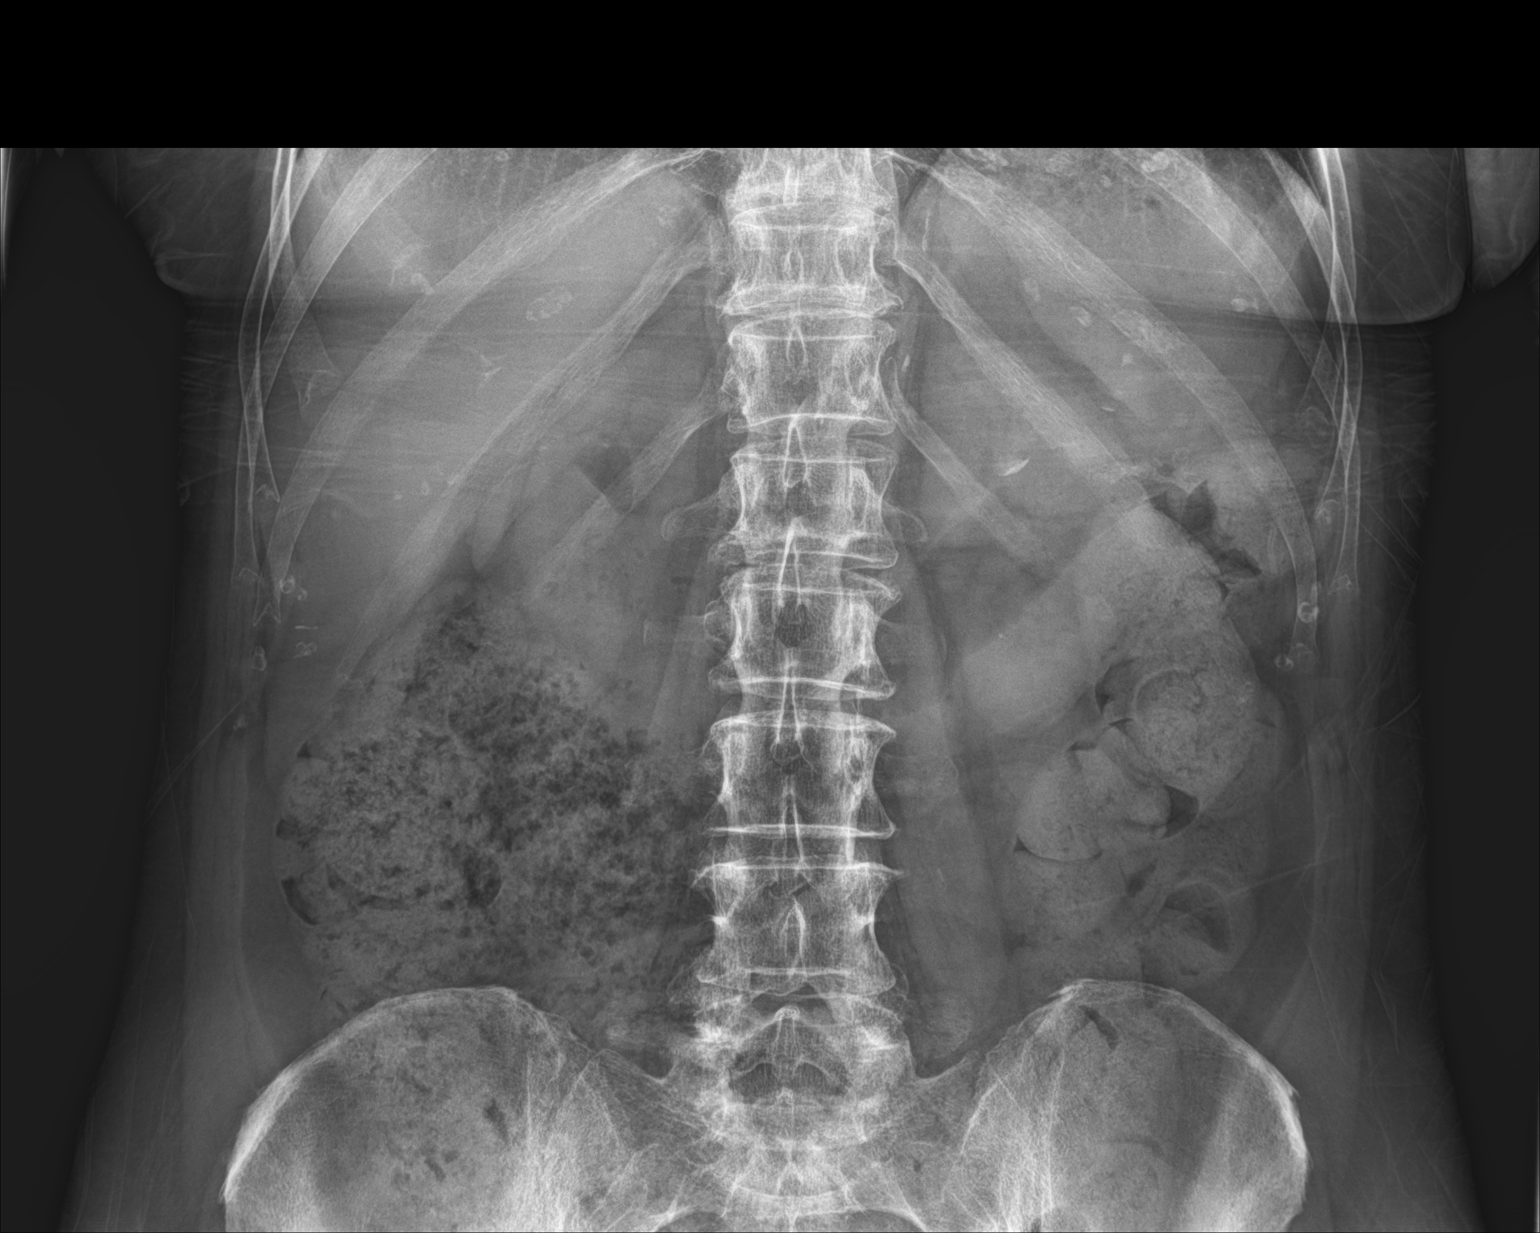

[2 of 2 positions shown; findings below may reference images not displayed]

FINDINGS: Large amount of fecal matter throughout the colon, increased
compared to the previous study. No sign of bowel obstruction. No
acute bone finding. Chronic sacroiliac degenerative changes on the
right. Previously seen calcification in the right pelvic sidewall
region no longer present, presumably passed.
IMPRESSION: 1. Large amount of fecal matter throughout the colon, increased
compared to the previous study.
2. Previously seen calcification in the right pelvic sidewall no
longer present, presumably passed.
